# Patient Record
Sex: Male | Born: 1935 | Race: White | Hispanic: No | Smoking: Former smoker
Health system: Southern US, Community
[De-identification: ages and names within clinical notes are randomized; demographics above are authoritative.]

## PROBLEM LIST (undated history)

## (undated) DIAGNOSIS — I2510 Atherosclerotic heart disease of native coronary artery without angina pectoris: Secondary | ICD-10-CM

## (undated) DIAGNOSIS — N289 Disorder of kidney and ureter, unspecified: Secondary | ICD-10-CM

## (undated) DIAGNOSIS — I509 Heart failure, unspecified: Secondary | ICD-10-CM

## (undated) DIAGNOSIS — L723 Sebaceous cyst: Secondary | ICD-10-CM

## (undated) HISTORY — PX: CORONARY ANGIOPLASTY WITH STENT PLACEMENT: SHX49

## (undated) HISTORY — PX: KNEE SURGERY: SHX244

## (undated) HISTORY — PX: IRRIGATION AND DEBRIDEMENT SEBACEOUS CYST: SHX5255

## (undated) HISTORY — PX: HEMORROIDECTOMY: SUR656

## (undated) HISTORY — DX: Sebaceous cyst: L72.3

---

## 2000-02-13 ENCOUNTER — Ambulatory Visit (HOSPITAL_COMMUNITY): Admission: RE | Admit: 2000-02-13 | Discharge: 2000-02-14 | Payer: Self-pay | Admitting: Cardiovascular Disease

## 2007-03-10 ENCOUNTER — Emergency Department (HOSPITAL_COMMUNITY): Admission: EM | Admit: 2007-03-10 | Discharge: 2007-03-10 | Payer: Self-pay | Admitting: Emergency Medicine

## 2007-07-03 ENCOUNTER — Encounter (INDEPENDENT_AMBULATORY_CARE_PROVIDER_SITE_OTHER): Payer: Self-pay | Admitting: General Surgery

## 2007-07-03 ENCOUNTER — Ambulatory Visit (HOSPITAL_COMMUNITY): Admission: RE | Admit: 2007-07-03 | Discharge: 2007-07-03 | Payer: Self-pay | Admitting: General Surgery

## 2010-07-03 NOTE — Op Note (Signed)
NAME:  Larry Morrow, Larry Morrow               ACCOUNT NO.:  [id number]   MEDICAL RECORD NO.:  [id number]          PATIENT TYPE:  AMB   LOCATION:  SDS                          FACILITY:  [facility]   PHYSICIAN:  Gabrielle Dare. Janee Morn, M.D.DATE OF BIRTH:  03/22/1923   DATE OF PROCEDURE:  DATE OF DISCHARGE:  07/03/2007                               OPERATIVE REPORT   PREOPERATIVE DIAGNOSES:  Cyst, left hip and cyst, back.   POSTOPERATIVE DIAGNOSES:  Cyst in left hip and cyst in back.   PROCEDURES:  Excision of cyst, left hip, 4 cm with layered closure and  excision of cyst, back, 3 cm with layered closure.   SURGEON:  Gabrielle Dare. Janee Morn, M.D.   ANESTHESIA:  MAC.   HISTORY OF PRESENT ILLNESS:  [name] is an 77 year old gentleman  whom I evaluated in the office for symptomatic likely sebaceous cyst on  his left hip and back.  He presents today for elective excision.   PROCEDURE IN DETAIL:  Informed consent was obtained.  The patient was  identified in the preop holding area.  His sites were marked.  He was  brought to the operating room and MAC anesthesia was administered by the  anesthesia staff.  He was placed in prone position.  Left hip and back  were prepped and draped in the sterile fashion.  A 0.25% Marcaine with  epinephrine mixed with 1% lidocaine plain was injected for local  anesthetic around the cyst on his left hip first.  An elliptical  incision was made to encompass the 2 skin defects that were present with  exposed cyst tissue.  Subcutaneous tissues were dissected down.  The  cyst sac was quite superficial, but large and extended deep.  It was  circumferentially dissected to excise the entire cyst using sharp  dissection.  Bovie cautery was then used to obtain hemostasis.  This was  sent to pathology.  The wound was copiously irrigated.  Some additional  local anesthetic was injected.  Hemostasis was ensured. The wound was  then closed in layers with deep tissue was approximated  with interrupted  3-0 Vicryl suture and skin was closed with running 4-0 Monocryl  subcuticular stitch.  Dermabond was then placed as the final closure.  Attention was redirected to the cyst on the back.  The area was  infiltrated with local as previously.  An elliptical incision was made  to include some of the overlying skin as well as the sinus.  There were  some breakdown of the skin, so a slight ellipse was created.  Subcutaneous tissues were dissected down to the cyst sac.  The sac was  circumferentially dissected completely excising it.  The wound was  copiously irrigated.  Meticulous hemostasis was obtained using Bovie  cautery.  The wound was then closed in layers with subcutaneous tissue  approximated with interrupted 3-0 Vicryl suture and the skin was closed  with running 4-0  Monocryl subcuticular stitch.  Dermabond was then placed as the final  closure.  Sponge, needle, and instrument counts were all correct.  The  patient tolerated the procedure well  without apparent complications.  He  was taken to the recovery room in stable condition.      Gabrielle Dare Janee Morn, M.D.  Electronically Signed     BET/MEDQ  D:  07/03/2007  T:  07/03/2007  Job:  [id number]   cc:   Barry Dienes. Eloise Harman, M.D.

## 2010-07-05 ENCOUNTER — Encounter (HOSPITAL_COMMUNITY)
Admission: RE | Admit: 2010-07-05 | Discharge: 2010-07-05 | Disposition: A | Discharge status: Home / Self Care | Payer: Medicare Other | Source: Ambulatory Visit | Attending: General Surgery | Admitting: General Surgery

## 2010-07-05 ENCOUNTER — Ambulatory Visit (HOSPITAL_COMMUNITY)
Admission: RE | Admit: 2010-07-05 | Discharge: 2010-07-05 | Disposition: A | Discharge status: Home / Self Care | Payer: Medicare Other | Source: Ambulatory Visit | Attending: General Surgery | Admitting: General Surgery

## 2010-07-05 ENCOUNTER — Other Ambulatory Visit (HOSPITAL_COMMUNITY): Payer: Self-pay | Admitting: General Surgery

## 2010-07-05 DIAGNOSIS — Z9889 Other specified postprocedural states: Secondary | ICD-10-CM

## 2010-07-05 DIAGNOSIS — Z01810 Encounter for preprocedural cardiovascular examination: Secondary | ICD-10-CM | POA: Insufficient documentation

## 2010-07-05 DIAGNOSIS — Z01812 Encounter for preprocedural laboratory examination: Secondary | ICD-10-CM | POA: Insufficient documentation

## 2010-07-05 DIAGNOSIS — Z01818 Encounter for other preprocedural examination: Secondary | ICD-10-CM | POA: Insufficient documentation

## 2010-07-05 LAB — BASIC METABOLIC PANEL
BUN: 15 mg/dL (ref 6–23)
CO2: 31 mEq/L (ref 19–32)
Chloride: 103 mEq/L (ref 96–112)
Creatinine, Ser: 0.79 mg/dL (ref 0.4–1.5)
Potassium: 3.9 mEq/L (ref 3.5–5.1)

## 2010-07-05 LAB — CBC
HCT: 38.9 % — ABNORMAL LOW (ref 39.0–52.0)
Hemoglobin: 13.3 g/dL (ref 13.0–17.0)
MCV: 96 fL (ref 78.0–100.0)
RBC: 4.05 MIL/uL — ABNORMAL LOW (ref 4.22–5.81)
RDW: 12.1 % (ref 11.5–15.5)
WBC: 6 10*3/uL (ref 4.0–10.5)

## 2010-07-10 ENCOUNTER — Ambulatory Visit (HOSPITAL_COMMUNITY)
Admission: RE | Admit: 2010-07-10 | Discharge: 2010-07-10 | Disposition: A | Discharge status: Home / Self Care | Payer: Medicare Other | Source: Ambulatory Visit | Attending: General Surgery | Admitting: General Surgery

## 2010-07-10 ENCOUNTER — Other Ambulatory Visit: Payer: Self-pay | Admitting: General Surgery

## 2010-07-10 DIAGNOSIS — E119 Type 2 diabetes mellitus without complications: Secondary | ICD-10-CM | POA: Insufficient documentation

## 2010-07-10 DIAGNOSIS — Z01818 Encounter for other preprocedural examination: Secondary | ICD-10-CM | POA: Insufficient documentation

## 2010-07-10 DIAGNOSIS — Z01810 Encounter for preprocedural cardiovascular examination: Secondary | ICD-10-CM | POA: Insufficient documentation

## 2010-07-10 DIAGNOSIS — Z01812 Encounter for preprocedural laboratory examination: Secondary | ICD-10-CM | POA: Insufficient documentation

## 2010-07-10 DIAGNOSIS — D235 Other benign neoplasm of skin of trunk: Secondary | ICD-10-CM | POA: Insufficient documentation

## 2010-07-10 LAB — GLUCOSE, CAPILLARY
Glucose-Capillary: 119 mg/dL — ABNORMAL HIGH (ref 70–99)
Glucose-Capillary: 121 mg/dL — ABNORMAL HIGH (ref 70–99)

## 2010-07-11 ENCOUNTER — Emergency Department (HOSPITAL_COMMUNITY)
Admission: EM | Admit: 2010-07-11 | Discharge: 2010-07-11 | Disposition: A | Discharge status: Home / Self Care | Payer: Medicare Other | Attending: Emergency Medicine | Admitting: Emergency Medicine

## 2010-07-11 DIAGNOSIS — N400 Enlarged prostate without lower urinary tract symptoms: Secondary | ICD-10-CM | POA: Insufficient documentation

## 2010-07-11 DIAGNOSIS — Z79899 Other long term (current) drug therapy: Secondary | ICD-10-CM | POA: Insufficient documentation

## 2010-07-11 DIAGNOSIS — R109 Unspecified abdominal pain: Secondary | ICD-10-CM | POA: Insufficient documentation

## 2010-07-11 DIAGNOSIS — N3289 Other specified disorders of bladder: Secondary | ICD-10-CM | POA: Insufficient documentation

## 2010-07-11 DIAGNOSIS — R339 Retention of urine, unspecified: Secondary | ICD-10-CM | POA: Insufficient documentation

## 2010-07-11 DIAGNOSIS — I2510 Atherosclerotic heart disease of native coronary artery without angina pectoris: Secondary | ICD-10-CM | POA: Insufficient documentation

## 2010-07-11 LAB — URINALYSIS, ROUTINE W REFLEX MICROSCOPIC
Nitrite: NEGATIVE
Specific Gravity, Urine: 1.017 (ref 1.005–1.030)
Urobilinogen, UA: 0.2 mg/dL (ref 0.0–1.0)
pH: 7 (ref 5.0–8.0)

## 2010-07-11 LAB — CBC
HCT: 36.9 % — ABNORMAL LOW (ref 39.0–52.0)
Platelets: 138 10*3/uL — ABNORMAL LOW (ref 150–400)
RBC: 3.87 MIL/uL — ABNORMAL LOW (ref 4.22–5.81)
RDW: 12.3 % (ref 11.5–15.5)
WBC: 7.3 10*3/uL (ref 4.0–10.5)

## 2010-07-11 LAB — DIFFERENTIAL
Basophils Absolute: 0 10*3/uL (ref 0.0–0.1)
Eosinophils Absolute: 0.1 10*3/uL (ref 0.0–0.7)
Eosinophils Relative: 1 % (ref 0–5)
Lymphocytes Relative: 9 % — ABNORMAL LOW (ref 12–46)
Lymphs Abs: 0.6 10*3/uL — ABNORMAL LOW (ref 0.7–4.0)
Neutrophils Relative %: 84 % — ABNORMAL HIGH (ref 43–77)

## 2010-07-11 LAB — BASIC METABOLIC PANEL
Chloride: 104 mEq/L (ref 96–112)
GFR calc non Af Amer: >60 mL/min (ref >60)
Potassium: 4 mEq/L (ref 3.5–5.1)
Sodium: 138 mEq/L (ref 135–145)

## 2010-07-11 NOTE — Op Note (Signed)
  NAME:  GAD, AYMOND NO.:  [id number]  MEDICAL RECORD NO.:  [id number]           PATIENT TYPE:  O  LOCATION:  SDSC                         FACILITY:  [facility]  PHYSICIAN:  Gabrielle Dare. Janee Morn, M.D.DATE OF BIRTH:  02/18/1924  DATE OF PROCEDURE:  07/10/2010 DATE OF DISCHARGE:  07/10/2010                              OPERATIVE REPORT   PREOPERATIVE DIAGNOSIS:  Cyst, left back.  POSTOPERATIVE DIAGNOSIS:  Cyst, left back.  PROCEDURE:  Excision of cyst on left back, 4 cm with layered closure.  SURGEON:  Gabrielle Dare. Janee Morn, MD  ANESTHESIA:  General endotracheal.  HISTORY OF PRESENT ILLNESS:  Mr. Rubin is an 77 year old gentleman who has a sebaceous cyst on his left back.  It became infected requiring incision and drainage that infection has improved and he presents today for elective excision of this cyst to prevent ongoing problems.  PROCEDURE IN DETAIL:  The patient was identified in the preop holding area.  Informed consent was obtained.  He received intravenous antibiotics.  His site was marked.  He was brought out to the operating room.  General endotracheal anesthesia was administered by the Anesthesia staff.  He was placed in the prone position.  The left back was prepped and draped in a sterile fashion.  We did a time-out procedure.  Next, 0.25% Marcaine with epinephrine was injected.  An elliptical area measured out around the cyst to facilitate complete excision and closure.  Elliptical incision was made.  Subcutaneous tissues were dissected down completely surrounding the cyst without rupturing it and it was excised in one piece.  The area was copiously irrigated.  Meticulous hemostasis was obtained.  Some additional local anesthetic was injected.  We raised flaps above the fascia medially and laterally to facilitate closure.  Subcutaneous tissues were approximated with interrupted 2-0 Vicryl sutures and skin was closed with interrupted 2-0 nylon  sutures in a simple fashion.  They came together nicely without significant tension.  Sponge, needle, and instrument counts were all correct.  Sterile dressing was applied.  The patient tolerated the procedure well without apparent complication and was taken to the recovery room in stable condition.     Gabrielle Dare Janee Morn, M.D.    BET/MEDQ  D:  07/10/2010  T:  07/11/2010  Job:  [id number]  cc:   Barry Dienes. Eloise Harman, M.D.  Electronically Signed by Violeta Gelinas M.D. on 07/11/2010 05:18:17 PM

## 2010-07-18 ENCOUNTER — Emergency Department (HOSPITAL_COMMUNITY)
Admission: EM | Admit: 2010-07-18 | Discharge: 2010-07-18 | Disposition: A | Discharge status: Home / Self Care | Payer: Medicare Other | Attending: Emergency Medicine | Admitting: Emergency Medicine

## 2010-07-18 DIAGNOSIS — N400 Enlarged prostate without lower urinary tract symptoms: Secondary | ICD-10-CM | POA: Insufficient documentation

## 2010-07-18 DIAGNOSIS — R339 Retention of urine, unspecified: Secondary | ICD-10-CM | POA: Insufficient documentation

## 2010-07-18 DIAGNOSIS — I2510 Atherosclerotic heart disease of native coronary artery without angina pectoris: Secondary | ICD-10-CM | POA: Insufficient documentation

## 2010-07-18 LAB — URINALYSIS, ROUTINE W REFLEX MICROSCOPIC
Nitrite: NEGATIVE
Specific Gravity, Urine: 1.018 (ref 1.005–1.030)
Urobilinogen, UA: 0.2 mg/dL (ref 0.0–1.0)

## 2010-07-18 LAB — URINE MICROSCOPIC-ADD ON

## 2010-07-19 LAB — URINE CULTURE: Culture  Setup Time: 201205310227

## 2010-08-17 ENCOUNTER — Emergency Department (HOSPITAL_COMMUNITY)
Admission: EM | Admit: 2010-08-17 | Discharge: 2010-08-17 | Disposition: A | Discharge status: Home / Self Care | Payer: Medicare Other | Attending: Emergency Medicine | Admitting: Emergency Medicine

## 2010-08-17 DIAGNOSIS — R339 Retention of urine, unspecified: Secondary | ICD-10-CM | POA: Insufficient documentation

## 2010-08-17 DIAGNOSIS — K5900 Constipation, unspecified: Secondary | ICD-10-CM | POA: Insufficient documentation

## 2010-08-17 DIAGNOSIS — N400 Enlarged prostate without lower urinary tract symptoms: Secondary | ICD-10-CM | POA: Insufficient documentation

## 2010-08-17 DIAGNOSIS — I2510 Atherosclerotic heart disease of native coronary artery without angina pectoris: Secondary | ICD-10-CM | POA: Insufficient documentation

## 2010-08-17 DIAGNOSIS — R109 Unspecified abdominal pain: Secondary | ICD-10-CM | POA: Insufficient documentation

## 2010-08-17 DIAGNOSIS — E7800 Pure hypercholesterolemia, unspecified: Secondary | ICD-10-CM | POA: Insufficient documentation

## 2010-08-17 LAB — BASIC METABOLIC PANEL
CO2: 23 mEq/L (ref 19–32)
Chloride: 102 mEq/L (ref 96–112)
Creatinine, Ser: 0.79 mg/dL (ref 0.50–1.35)
Sodium: 137 mEq/L (ref 135–145)

## 2010-08-17 LAB — CBC
MCV: 95.1 fL (ref 78.0–100.0)
Platelets: 155 10*3/uL (ref 150–400)
RBC: 4.25 MIL/uL (ref 4.22–5.81)
WBC: 5.8 10*3/uL (ref 4.0–10.5)

## 2010-08-17 LAB — DIFFERENTIAL
Eosinophils Absolute: 0.3 10*3/uL (ref 0.0–0.7)
Lymphs Abs: 1.1 10*3/uL (ref 0.7–4.0)
Neutro Abs: 3.8 10*3/uL (ref 1.7–7.7)
Neutrophils Relative %: 66 % (ref 43–77)

## 2010-08-17 LAB — URINALYSIS, ROUTINE W REFLEX MICROSCOPIC
Glucose, UA: NEGATIVE mg/dL
Leukocytes, UA: NEGATIVE
pH: 6 (ref 5.0–8.0)

## 2010-08-18 LAB — URINE CULTURE
Colony Count: NO GROWTH
Culture: NO GROWTH

## 2010-11-09 ENCOUNTER — Emergency Department (HOSPITAL_COMMUNITY)
Admission: EM | Admit: 2010-11-09 | Discharge: 2010-11-10 | Disposition: A | Discharge status: Home / Self Care | Payer: Medicare Other | Attending: Emergency Medicine | Admitting: Emergency Medicine

## 2010-11-09 DIAGNOSIS — I2510 Atherosclerotic heart disease of native coronary artery without angina pectoris: Secondary | ICD-10-CM | POA: Insufficient documentation

## 2010-11-09 DIAGNOSIS — E7800 Pure hypercholesterolemia, unspecified: Secondary | ICD-10-CM | POA: Insufficient documentation

## 2010-11-09 DIAGNOSIS — R300 Dysuria: Secondary | ICD-10-CM | POA: Insufficient documentation

## 2010-11-09 DIAGNOSIS — T8389XA Other specified complication of genitourinary prosthetic devices, implants and grafts, initial encounter: Secondary | ICD-10-CM | POA: Insufficient documentation

## 2010-11-09 DIAGNOSIS — N400 Enlarged prostate without lower urinary tract symptoms: Secondary | ICD-10-CM | POA: Insufficient documentation

## 2010-11-09 DIAGNOSIS — Y846 Urinary catheterization as the cause of abnormal reaction of the patient, or of later complication, without mention of misadventure at the time of the procedure: Secondary | ICD-10-CM | POA: Insufficient documentation

## 2010-11-09 LAB — COMPREHENSIVE METABOLIC PANEL
ALT: 31
AST: 38 — ABNORMAL HIGH
Albumin: 3.9
Alkaline Phosphatase: 49
BUN: 21
CO2: 21
Calcium: 8.5
Chloride: 105
Creatinine, Ser: 0.96
GFR calc Af Amer: >60
GFR calc non Af Amer: >60
Glucose, Bld: 141 — ABNORMAL HIGH
Potassium: 3.8
Sodium: 137
Total Bilirubin: 1.2
Total Protein: 6.9

## 2010-11-09 LAB — URINALYSIS, ROUTINE W REFLEX MICROSCOPIC
Bilirubin Urine: NEGATIVE
Glucose, UA: NEGATIVE
Ketones, ur: 15 — AB
Leukocytes, UA: NEGATIVE
Nitrite: NEGATIVE
Protein, ur: NEGATIVE
Specific Gravity, Urine: 1.013
Urobilinogen, UA: 0.2
pH: 5.5

## 2010-11-09 LAB — LIPASE, BLOOD: Lipase: 25

## 2010-11-09 LAB — DIFFERENTIAL
Basophils Absolute: 0
Basophils Relative: 0
Eosinophils Absolute: 0.1
Eosinophils Relative: 1
Lymphocytes Relative: 12
Lymphs Abs: 0.9
Monocytes Absolute: 0.7
Monocytes Relative: 9
Neutro Abs: 6.1
Neutrophils Relative %: 78 — ABNORMAL HIGH

## 2010-11-09 LAB — LACTIC ACID, PLASMA: Lactic Acid, Venous: 1.6

## 2010-11-09 LAB — URINE MICROSCOPIC-ADD ON

## 2010-11-09 LAB — CBC
HCT: 41.7
Hemoglobin: 14.5
MCHC: 34.9
MCV: 94.8
Platelets: 164
RBC: 4.4
RDW: 12.6
WBC: 7.9

## 2011-01-10 ENCOUNTER — Emergency Department (HOSPITAL_COMMUNITY)
Admission: EM | Admit: 2011-01-10 | Discharge: 2011-01-11 | Disposition: A | Discharge status: Home / Self Care | Payer: Medicare Other | Attending: Emergency Medicine | Admitting: Emergency Medicine

## 2011-01-10 ENCOUNTER — Encounter (HOSPITAL_COMMUNITY): Payer: Self-pay | Admitting: Emergency Medicine

## 2011-01-10 DIAGNOSIS — J45909 Unspecified asthma, uncomplicated: Secondary | ICD-10-CM | POA: Insufficient documentation

## 2011-01-10 DIAGNOSIS — I2510 Atherosclerotic heart disease of native coronary artery without angina pectoris: Secondary | ICD-10-CM | POA: Insufficient documentation

## 2011-01-10 DIAGNOSIS — I509 Heart failure, unspecified: Secondary | ICD-10-CM | POA: Insufficient documentation

## 2011-01-10 DIAGNOSIS — R339 Retention of urine, unspecified: Secondary | ICD-10-CM

## 2011-01-10 DIAGNOSIS — N289 Disorder of kidney and ureter, unspecified: Secondary | ICD-10-CM | POA: Insufficient documentation

## 2011-01-10 DIAGNOSIS — N390 Urinary tract infection, site not specified: Secondary | ICD-10-CM | POA: Insufficient documentation

## 2011-01-10 DIAGNOSIS — E119 Type 2 diabetes mellitus without complications: Secondary | ICD-10-CM | POA: Insufficient documentation

## 2011-01-10 HISTORY — DX: Disorder of kidney and ureter, unspecified: N28.9

## 2011-01-10 HISTORY — DX: Atherosclerotic heart disease of native coronary artery without angina pectoris: I25.10

## 2011-01-10 HISTORY — DX: Heart failure, unspecified: I50.9

## 2011-01-10 LAB — URINALYSIS, ROUTINE W REFLEX MICROSCOPIC
Nitrite: NEGATIVE
Specific Gravity, Urine: 1.019 (ref 1.005–1.030)
Urobilinogen, UA: 0.2 mg/dL (ref 0.0–1.0)

## 2011-01-10 LAB — URINE MICROSCOPIC-ADD ON

## 2011-01-10 MED ORDER — AMOXICILLIN 500 MG PO CAPS: 500.0000 mg | ORAL_CAPSULE | Freq: Three times a day (TID) | ORAL | Status: DC

## 2011-01-10 MED ORDER — CEPHALEXIN 500 MG PO CAPS: 500.0000 mg | ORAL_CAPSULE | Freq: Four times a day (QID) | ORAL | Status: AC

## 2011-01-10 NOTE — ED Provider Notes (Signed)
History     CSN: [id number] Arrival date & time: 01/10/2011  9:46 PM   First MD Initiated Contact with Patient 01/10/11 2156      No chief complaint on file.   HPI  History provided by the patient and family. Patient presents with complaints of urinary retention and began early this morning. Patient states his last episode of voiding was around 3 AM. He has not been able to void a significant amount since that time. Patient has history of similar symptoms the past and reports having a Foley catheter removed last Monday. Patient currently follows with Dr. Earlene Plater with urology. Symptoms progressed gradually throughout the day with fullness in lower pelvic area and some pain. Patient denies symptoms of fever, chills, sweats, nausea and vomiting. Patient denies flank pain.   Past Medical History  Diagnosis Date  . Asthma   . CHF (congestive heart failure)   . Coronary artery disease   . Diabetes mellitus   . Renal disorder     No past surgical history on file.  No family history on file.  History  Substance Use Topics  . Smoking status: Former Games developer  . Smokeless tobacco: Not on file  . Alcohol Use: No      Review of Systems  Constitutional: Negative for fever and chills.  Respiratory: Negative for cough and shortness of breath.   Gastrointestinal: Negative for nausea, vomiting, diarrhea and constipation.  Genitourinary: Positive for difficulty urinating. Negative for dysuria, frequency, hematuria and flank pain.  Neurological: Negative for light-headedness and headaches.  All other systems reviewed and are negative.    Allergies  Review of patient's allergies indicates no known allergies.  Home Medications  No current outpatient prescriptions on file.  BP 154/67  Pulse 92  Temp(Src) 97.5 F (36.4 C) (Oral)  Resp 18  Ht 5\' 9"  (1.753 m)  Wt 164 lb (74.39 kg)  BMI 24.22 kg/m2  SpO2 97%  Physical Exam  Nursing note and vitals reviewed. Constitutional: He is  oriented to person, place, and time. He appears well-developed and well-nourished. No distress.  HENT:  Head: Normocephalic.  Cardiovascular: Normal rate and regular rhythm.   No murmur heard. Pulmonary/Chest: Effort normal.  Abdominal: Soft. He exhibits no distension. There is tenderness in the suprapubic area. There is no rebound and no guarding.  Neurological: He is alert and oriented to person, place, and time. No cranial nerve deficit.  Skin: Skin is warm.  Psychiatric: He has a normal mood and affect.    ED Course  Procedures (including critical care time)  Labs Reviewed  URINALYSIS, ROUTINE W REFLEX MICROSCOPIC - Abnormal; Notable for the following:    Appearance CLOUDY (*)    Glucose, UA 250 (*)    Hgb urine dipstick MODERATE (*)    Ketones, ur TRACE (*)    Protein, ur 30 (*)    Leukocytes, UA LARGE (*)    All other components within normal limits  URINE MICROSCOPIC-ADD ON - Abnormal; Notable for the following:    Bacteria, UA MANY (*)    All other components within normal limits  URINE CULTURE   Results for orders placed during the hospital encounter of 01/10/11  URINALYSIS, ROUTINE W REFLEX MICROSCOPIC      Component Value Range   Color, Urine YELLOW  YELLOW    Appearance CLOUDY (*) CLEAR    Specific Gravity, Urine 1.019  1.005 - 1.030    pH 6.0  5.0 - 8.0    Glucose, UA 250 (*)  NEGATIVE (mg/dL)   Hgb urine dipstick MODERATE (*) NEGATIVE    Bilirubin Urine NEGATIVE  NEGATIVE    Ketones, ur TRACE (*) NEGATIVE (mg/dL)   Protein, ur 30 (*) NEGATIVE (mg/dL)   Urobilinogen, UA 0.2  0.0 - 1.0 (mg/dL)   Nitrite NEGATIVE  NEGATIVE    Leukocytes, UA LARGE (*) NEGATIVE   URINE MICROSCOPIC-ADD ON      Component Value Range   WBC, UA TOO NUMEROUS TO COUNT  <3 (WBC/hpf)   Bacteria, UA MANY (*) RARE      1. Urinary retention   2. UTI (urinary tract infection)     MDM  10 PM patient seen and evaluated patient in no acute distress. Patient feels relief of symptoms  after Foley catheter placed. Foley catheter with large amounts of drainage at this time. Will send urine for evaluation and culture.        Angus Seller, PA 01/11/11 [phone]

## 2011-01-10 NOTE — ED Notes (Signed)
pATIENT PRESENTED TO THE ER with c/o of urinary retention since 0300 today.  Daughter sts that the patient had a foley cat for the past month and had it removed on Monday.  Patient has been voiding fine until this am

## 2011-01-10 NOTE — ED Notes (Signed)
Patient extremely uncomfortable.  Min. Bladder distension noted on arrival.   Foley 14 Fr inserted  W/o difficulty with return of yellow cloudy urine .  Peter PA in to see and evaluiate

## 2011-01-11 NOTE — ED Notes (Signed)
Computer maint occuring patient unable to give 3 signature.  Prior to d/c patient wants folet bsd changed to leg bag as requested

## 2011-01-11 NOTE — ED Notes (Signed)
Will d/c to home with foley in place per PA's instruction. No further pain once bladder was emptied

## 2011-01-12 NOTE — ED Provider Notes (Signed)
Medical screening examination/treatment/procedure(s) were performed by non-physician practitioner and as supervising physician I was immediately available for consultation/collaboration.   Jackqueline Aquilar A. Patrica Duel, MD 01/12/11 1610

## 2011-01-13 LAB — URINE CULTURE

## 2011-01-14 NOTE — ED Notes (Signed)
+   urine Patient treated with Keflex-sensitive to same-chart appended per protocol MD. 

## 2011-01-22 ENCOUNTER — Encounter (HOSPITAL_COMMUNITY): Payer: Self-pay | Admitting: *Deleted

## 2011-01-22 ENCOUNTER — Emergency Department (HOSPITAL_COMMUNITY)
Admission: EM | Admit: 2011-01-22 | Discharge: 2011-01-22 | Disposition: A | Discharge status: Home / Self Care | Payer: Medicare Other | Attending: Emergency Medicine | Admitting: Emergency Medicine

## 2011-01-22 DIAGNOSIS — R339 Retention of urine, unspecified: Secondary | ICD-10-CM

## 2011-01-22 DIAGNOSIS — I2510 Atherosclerotic heart disease of native coronary artery without angina pectoris: Secondary | ICD-10-CM | POA: Insufficient documentation

## 2011-01-22 DIAGNOSIS — R10819 Abdominal tenderness, unspecified site: Secondary | ICD-10-CM | POA: Insufficient documentation

## 2011-01-22 DIAGNOSIS — E119 Type 2 diabetes mellitus without complications: Secondary | ICD-10-CM | POA: Insufficient documentation

## 2011-01-22 DIAGNOSIS — N390 Urinary tract infection, site not specified: Secondary | ICD-10-CM

## 2011-01-22 LAB — URINE MICROSCOPIC-ADD ON

## 2011-01-22 LAB — URINALYSIS, ROUTINE W REFLEX MICROSCOPIC
Bilirubin Urine: NEGATIVE
Nitrite: POSITIVE — AB
Protein, ur: NEGATIVE mg/dL
Specific Gravity, Urine: 1.01 (ref 1.005–1.030)
Urobilinogen, UA: 0.2 mg/dL (ref 0.0–1.0)

## 2011-01-22 LAB — POCT I-STAT, CHEM 8
Chloride: 106 mEq/L (ref 96–112)
Glucose, Bld: 150 mg/dL — ABNORMAL HIGH (ref 70–99)
HCT: 40 % (ref 39.0–52.0)
Potassium: 3.4 mEq/L — ABNORMAL LOW (ref 3.5–5.1)
Sodium: 139 mEq/L (ref 135–145)

## 2011-01-22 MED ORDER — SULFAMETHOXAZOLE-TRIMETHOPRIM 800-160 MG PO TABS: 1.0000 | ORAL_TABLET | Freq: Two times a day (BID) | ORAL | Status: AC

## 2011-01-22 NOTE — Discharge Planning (Signed)
Spoke with Larry Morrow Dtr who confirms she is spoke with staff at Larry Morrow and Larry Morrow office.  Appt made with Larry Morrow for next Monday, December, 9, 2012 and scheduled to call back to speak again with Larry Larry Morrow on 01/23/11 since the office is closed for today.  Larry states she will contact Larry Morrow if needed Larry Morrow Explained to Larry Larry Morrow order is needed prior to Larry Morrow being able to assist.  Larry Morrow states it may be 1 week before Larry Morrow may need Morrow Informed Larry Morrow and Larry Morrow forms sent in mail to her and Larry Morrow

## 2011-01-22 NOTE — Discharge Planning (Signed)
Unable to reach staff at Dr Jarome Matin office [phone]) Office closed from 12-2 pm per answering service

## 2011-01-22 NOTE — Discharge Planning (Signed)
CM left a message with answering service for Dr Jinny Sanders RN at [phone] Requested a return call

## 2011-01-22 NOTE — Discharge Planning (Signed)
ED CM went to see pt in Rm #10 at 1110. Velna Hatchet, RN states pt "just left with his daughter"  Called pt and daughter, Gigi Gin at pt home [phone] at 11:57 am. CM discussed differences between home health and private duty services (LOS, duties, insurance coverage) .  Pt states presently he has an urinary infection per ED lab results obtained 01/22/11.  Pt stating he does not need staff to stay "overnight". Gigi Gin, who lives at address [address] [place], [place] [address], states pt does need someone to stay overnight when pt has to return to in/out catheterization. Daughter generally comes to stay with pt  Monday- Tuesday every week.  Pt now presently has a foley & antibiotics for 7 days ordered during [facility] ED visit 01/22/11.  Has previously used a foley for 6 months.   Urologist Darvin Neighbours) office staff , has ordered caude and/or straight catheters. Permission given to Cm by pt to contact Dr Earlene Plater office.  Pt states Dr Ivery Quale will not be familiar with this issue.  Pt chose and agreed [facility] use [facility] RN + other services.  Pt emphasized he would like also to be instructed on "breathing", & Mobility Pt has noted decrease in success with in/out cath due to recurrent infection.   Agreed [facility] may note have a skill to instruct until the foley is removed Pt has no follow up appointments with pcp nor urologist at this time

## 2011-01-22 NOTE — ED Provider Notes (Signed)
Medical screening examination/treatment/procedure(s) were performed by non-physician practitioner and as supervising physician I was immediately available for consultation/collaboration.  Flint Melter, MD 01/22/11 2105

## 2011-01-22 NOTE — ED Notes (Signed)
Pt states "had foley removed from Dr. Earlene Plater office yesterday, was provided with self caths"; pt has only been successful x 1

## 2011-01-22 NOTE — Discharge Planning (Signed)
ED CM spoke to [facility] at Dr Eloise Harman office.  Liborio Nixon states pt's daughter called office earlier today, the office is now closed and the dtr Knows to call back to Dr Eloise Harman office on 01/23/11 to have it setup.

## 2011-01-22 NOTE — ED Provider Notes (Signed)
History     CSN: [id number] Arrival date & time: 01/22/2011  7:07 AM   First MD Initiated Contact with Patient 01/22/11 [phone]      Chief Complaint  Patient presents with  . Urinary Retention   HPI Patient presents to the emergency department with complaint of urinary retention for the past day. Reports that he was seen at Dr. Earlene Plater office yesterday, and advised to self cath. Did this times once, at 1 am. However, is scared to do this on his own. Reports some mild suprapubic pressure. Denies any nausea, vomiting, fevers, chills, back pain or other complaints.   Past Medical History  Diagnosis Date  . Asthma   . CHF (congestive heart failure)   . Coronary artery disease   . Diabetes mellitus   . Renal disorder     Past Surgical History  Procedure Date  . Hemorroidectomy   . Knee surgery     bilat  . Coronary angioplasty with stent placement     No family history on file.  History  Substance Use Topics  . Smoking status: Former Games developer  . Smokeless tobacco: Not on file  . Alcohol Use: No      Review of Systems  Constitutional: Negative for fever, chills, diaphoresis and appetite change.  HENT: Negative for neck pain.   Eyes: Negative for photophobia and visual disturbance.  Respiratory: Negative for cough, chest tightness and shortness of breath.   Cardiovascular: Negative for chest pain.  Gastrointestinal: Negative for nausea and vomiting.  Genitourinary: Positive for difficulty urinating. Negative for dysuria, hematuria, flank pain, discharge, penile swelling, scrotal swelling, genital sores, penile pain and testicular pain.  Musculoskeletal: Negative for back pain.  Skin: Negative for rash.  Neurological: Negative for weakness and numbness.  All other systems reviewed and are negative.    Allergies  Review of patient's allergies indicates no known allergies.  Home Medications   Current Outpatient Rx  Name Route Sig Dispense Refill  . FIBER 5 GM/15ML PO  LIQD Oral Take 5 mLs by mouth daily. constipation     . SILODOSIN 8 MG PO CAPS Oral Take 8 mg by mouth daily with breakfast.      . SIMVASTATIN 40 MG PO TABS Oral Take 40 mg by mouth at bedtime.        BP 123/82  Pulse 77  Temp(Src) 97.2 F (36.2 C) (Oral)  Resp 20  Wt 154 lb (69.854 kg)  SpO2 98%  Physical Exam  Constitutional: He is oriented to person, place, and time. He appears well-developed and well-nourished.  HENT:  Head: Normocephalic and atraumatic.  Neck: Normal range of motion. Neck supple.  Cardiovascular: Normal rate and regular rhythm.   Pulmonary/Chest: Effort normal and breath sounds normal.  Abdominal:       Suprapubic tenderness  Genitourinary: Penis normal. No penile tenderness.       Chaperone present  Neurological: He is alert and oriented to person, place, and time.  Skin: Skin is dry. No rash noted. He is not diaphoretic. No erythema.    ED Course  Procedures (including critical care time)  Patient seen and evaluated.  VSS reviewed. . Nursing notes reviewed. Discussed with attending physician, Dr. Effie Shy. Initial testing ordered. Will monitor the patient closely. They agree with the treatment plan and diagnosis.   Results for orders placed during the hospital encounter of 01/22/11  URINALYSIS, ROUTINE W REFLEX MICROSCOPIC      Component Value Range   Color, Urine YELLOW  YELLOW  APPearance CLOUDY (*) CLEAR    Specific Gravity, Urine 1.010  1.005 - 1.030    pH 6.0  5.0 - 8.0    Glucose, UA NEGATIVE  NEGATIVE (mg/dL)   Hgb urine dipstick LARGE (*) NEGATIVE    Bilirubin Urine NEGATIVE  NEGATIVE    Ketones, ur NEGATIVE  NEGATIVE (mg/dL)   Protein, ur NEGATIVE  NEGATIVE (mg/dL)   Urobilinogen, UA 0.2  0.0 - 1.0 (mg/dL)   Nitrite POSITIVE (*) NEGATIVE    Leukocytes, UA LARGE (*) NEGATIVE   POCT I-STAT, CHEM 8      Component Value Range   Sodium 139  135 - 145 (mEq/L)   Potassium 3.4 (*) 3.5 - 5.1 (mEq/L)   Chloride 106  96 - 112 (mEq/L)   BUN 17   6 - 23 (mg/dL)   Creatinine, Ser 1.61  0.50 - 1.35 (mg/dL)   Glucose, Bld 096 (*) 70 - 99 (mg/dL)   Calcium, Ion 0.45  4.09 - 1.32 (mmol/L)   TCO2 22  0 - 100 (mmol/L)   Hemoglobin 13.6  13.0 - 17.0 (g/dL)   HCT 81.1  91.4 - 78.2 (%)  URINE MICROSCOPIC-ADD ON      Component Value Range   Squamous Epithelial / LPF FEW (*) RARE    WBC, UA 11-20  <3 (WBC/hpf)   RBC / HPF TOO NUMEROUS TO COUNT  <3 (RBC/hpf)   Bacteria, UA MANY (*) RARE    9:33 AM last culture report showed that his urine was sensitive to septra and most medications.   10:23 AM spoke to pharamacist, will treat with septa for 7 days  10:05 AM spoke extensively to dr. Eloise Harman, family, and case manager about care plan. Final plan is to place foley catheter in, until the patient can have home health, which the case manager said would be within the next day. Will start on antibiotics.   MDM  Urinary retention, no renal insufficiency.  Urinary tract infection          Demetrius Charity, PA 01/22/11 1023

## 2011-01-24 LAB — URINE CULTURE

## 2011-01-26 NOTE — ED Notes (Signed)
+   urine culture. Treated with Septra, sensitive to same per protocol MD. 

## 2011-03-18 ENCOUNTER — Ambulatory Visit
Admission: RE | Admit: 2011-03-18 | Discharge: 2011-03-18 | Disposition: A | Discharge status: Home / Self Care | Payer: Medicare Other | Source: Ambulatory Visit | Attending: Urology | Admitting: Urology

## 2011-03-18 ENCOUNTER — Other Ambulatory Visit: Payer: Self-pay | Admitting: Urology

## 2011-03-18 DIAGNOSIS — M549 Dorsalgia, unspecified: Secondary | ICD-10-CM

## 2011-04-22 ENCOUNTER — Encounter: Payer: Self-pay | Admitting: Internal Medicine

## 2011-04-25 ENCOUNTER — Other Ambulatory Visit (INDEPENDENT_AMBULATORY_CARE_PROVIDER_SITE_OTHER): Payer: Medicare Other

## 2011-04-25 ENCOUNTER — Ambulatory Visit (INDEPENDENT_AMBULATORY_CARE_PROVIDER_SITE_OTHER): Payer: Medicare Other | Admitting: Internal Medicine

## 2011-04-25 ENCOUNTER — Encounter: Payer: Self-pay | Admitting: Internal Medicine

## 2011-04-25 VITALS — BP 110/60 | HR 76 | Temp 98.6°F | Ht 69.5 in | Wt 150.1 lb

## 2011-04-25 DIAGNOSIS — K921 Melena: Secondary | ICD-10-CM

## 2011-04-25 DIAGNOSIS — R339 Retention of urine, unspecified: Secondary | ICD-10-CM

## 2011-04-25 DIAGNOSIS — M1990 Unspecified osteoarthritis, unspecified site: Secondary | ICD-10-CM | POA: Insufficient documentation

## 2011-04-25 DIAGNOSIS — E785 Hyperlipidemia, unspecified: Secondary | ICD-10-CM | POA: Insufficient documentation

## 2011-04-25 DIAGNOSIS — I2510 Atherosclerotic heart disease of native coronary artery without angina pectoris: Secondary | ICD-10-CM | POA: Insufficient documentation

## 2011-04-25 DIAGNOSIS — K5900 Constipation, unspecified: Secondary | ICD-10-CM | POA: Insufficient documentation

## 2011-04-25 LAB — CBC WITH DIFFERENTIAL/PLATELET
Basophils Absolute: 0 10*3/uL (ref 0.0–0.1)
Eosinophils Absolute: 0.2 10*3/uL (ref 0.0–0.7)
Hemoglobin: 12.9 g/dL — ABNORMAL LOW (ref 13.0–17.0)
Lymphocytes Relative: 19.1 % (ref 12.0–46.0)
MCHC: 34 g/dL (ref 30.0–36.0)
Monocytes Relative: 11.2 % (ref 3.0–12.0)
Neutro Abs: 4.3 10*3/uL (ref 1.4–7.7)
Neutrophils Relative %: 66.8 % (ref 43.0–77.0)
Platelets: 197 10*3/uL (ref 150.0–400.0)
RDW: 13 % (ref 11.5–14.6)

## 2011-04-25 MED ORDER — SENNA-DOCUSATE SODIUM 8.6-50 MG PO TABS: 1.0000 | ORAL_TABLET | Freq: Every day | ORAL | Status: DC

## 2011-04-25 NOTE — Progress Notes (Signed)
Subjective:    Patient ID: Larry Para., male    DOB: October 31, 1923, 78 y.o.   MRN: [id number]  HPI Larry Morrow is an 78 yo male with past medical history of hyperlipidemia, chronic insomnia, osteoporosis, CAD, ED who is seen in consultation at the request of Dr. Eloise Harman her evaluation of melena. The patient reports today recent issue with black stool. This started several weeks ago, but seemed to clear about 4 days ago. He reports prior to clearing his black stool, he was having on and off over several weeks. He reports it seemed to happen more when he was using aspirin and ibuprofen for joint pain. He denies abdominal pain, nausea or vomiting. His appetite has been good. He reports now his stools are brown, but really hard and difficult to pass. He reports long-standing issues with constipation. He added a fiber supplement which she states worked well at first, but seemed to lose its efficacy. He then tried MiraLAX which also worked well at first but eventually led to too much gas and stools which were too loose. He tried reducing the MiraLAX dose in half but still was not happy with the result. He denies hematemesis. He does report occasional scant bright red blood with wiping which he attributes to hemorrhoids. He recalls hemorrhoidectomy in his early 90s and has had scant rectal bleeding ever since. One of his biggest complaint today, continues to be urinary retention issues with frequent urination at night. He does intermittently self catheterize himself, but reports not having to do this for 4 or 5 days now. He denies dysuria. Denies hematuria. Again, no abdominal or suprapubic pain.  He was seen by his primary care provider in late February and started on Prilosec 20 mg twice a day. He has been taking this as directed, and he is also tried to avoid aspirin and ibuprofen recently  Review of Systems As per history of present illness, otherwise negative  Past Medical History  Diagnosis Date    . Asthma   . CHF (congestive heart failure)     patient denies  . Coronary artery disease   . Diabetes mellitus   . Renal disorder   . Sebaceous cyst     on the back   Past Surgical History  Procedure Date  . Hemorroidectomy   . Knee surgery     left x2  . Coronary angioplasty with stent placement   . Irrigation and debridement sebaceous cyst     removed off back   Current Outpatient Prescriptions  Medication Sig Dispense Refill  . ciprofloxacin (CIPRO) 500 MG tablet Take 500 mg by mouth 2 (two) times daily.      . Fiber 5 GM/15ML LIQD Take 5 mLs by mouth daily. constipation       . finasteride (PROSCAR) 5 MG tablet Take 5 mg by mouth daily.      Marland Kitchen omeprazole (PRILOSEC) 20 MG capsule Take 20 mg by mouth 2 (two) times daily.      . silodosin (RAPAFLO) 8 MG CAPS capsule Take 8 mg by mouth daily with breakfast.        . simvastatin (ZOCOR) 40 MG tablet Take 40 mg by mouth at bedtime.        . sennosides-docusate sodium (SENOKOT-S) 8.6-50 MG tablet Take 1 tablet by mouth daily.  30 tablet  3   No Known Allergies  Family History  Problem Relation Age of Onset  . Heart disease Father     Social History  .  Marital Status: Widowed    Number of Children: 4   Occupational History  . retired    Social History Main Topics  . Smoking status: Former Smoker    Types: Cigarettes  . Smokeless tobacco: Never Used  . Alcohol Use: Yes     2 oz of wine per day  . Drug Use: No      Objective:   Physical Exam BP 110/60  Pulse 76  Temp 98.6 F (37 C)  Ht 5' 9.5" (1.765 m)  Wt 150 lb 2 oz (68.096 kg)  BMI 21.85 kg/m2 Constitutional: Well-developed and well-nourished. No distress. HEENT: Normocephalic and atraumatic. Oropharynx is clear and moist. No oropharyngeal exudate. Conjunctivae are normal. Pupils are equal round and reactive to light. No scleral icterus. Neck: Neck supple. Trachea midline. Cardiovascular: Normal rate, regular rhythm and intact distal pulses. No  M/R/G Pulmonary/chest: Effort normal and breath sounds normal. No wheezing, rales or rhonchi. Abdominal: Soft, nontender, nondistended. Bowel sounds active throughout. There are no masses palpable. No hepatosplenomegaly. Extremities: no clubbing, cyanosis, or edema Rectal: Patient declines Lymphadenopathy: No cervical adenopathy noted. Neurological: Alert and oriented to person place and time. Skin: Skin is warm and dry. No rashes noted. Psychiatric: Normal mood and affect. Behavior is normal.  Labs from primary care dated 04/04/2011 WBC 5.9, hemoglobin 14.4, hematocrit 42.3, platelet 299, MCV 101.3  Sodium 140, potassium 3.8, chloride 106, carbon dioxide 27, BUN 17, creatinine 0.8, glucose 105, Total protein 7.0, albumin 3.3, AST 29 ALT 28 alkaline phosphatase 61 total bili 0.5     Assessment & Plan:  78 yo male with past medical history of hyperlipidemia, chronic insomnia, osteoporosis, CAD, ED who is seen in consultation at the request of Dr. Eloise Harman her evaluation of melena  1. Melena -- the patient reports that his black stools resolved about 4 days ago it has not returned. He has been on twice daily PPI for the last 2 weeks, and it is possible that he had gastritis or possibly even peptic ulceration, which has responded favorably to acid suppression. At this point I have recommended repeat CBC to ensure he is not becoming anemic, and I have recommended upper endoscopy. He wishes to avoid upper endoscopy and declines scheduling this today. He is nervous about sedated procedures at his age. With this in mind, I have recommended he continue the twice-daily PPI for now, as this would treat gastritis and peptic ulceration should this be the etiology. I also have recommended that he continue to avoid NSAIDs whenever possible. If his melena returns, or he develop other alarm symptoms such as epigastric pain, nausea or vomiting, then we will have to revisit possible upper endoscopy.  2.  Constipation -- this is a long-standing issue for the patient and according to his last primary care office visit, he has documented last date of colonoscopy 04/05/2010.  I do not have his colonoscopy report. He did not have a favorable response to MiraLAX, and therefore we will attempt a trial of senna plus docusate one capsule daily. He can continue with his fiber supplementation. I also have encouraged him to drink plenty of fluids throughout the day. We will see him back in 2-4 weeks' time to re\re assess his symptoms. At this polyp we will also insure no further melena or concern of upper GI bleeding.  3. Urinary retention -- this is one of his biggest complaints today, and at present he is reluctant to return to urology. I've advised him that I have no  further recommendations regarding his urinary issues, but referred him back to his primary care and urology for this issue. He is already on silodosin and finasteride.

## 2011-04-25 NOTE — Patient Instructions (Signed)
Your physician has requested that you go to the basement for the following lab work before leaving today: CBC  Continue taking Omeprazole 20mg  daily and your fiber supplement.   Dr. Rhea Belton would like to see you back in the office in 1 month for a follow up appointment.

## 2011-06-10 ENCOUNTER — Encounter: Payer: Self-pay | Admitting: Internal Medicine

## 2011-06-11 ENCOUNTER — Ambulatory Visit (INDEPENDENT_AMBULATORY_CARE_PROVIDER_SITE_OTHER): Payer: Medicare Other | Admitting: Internal Medicine

## 2011-06-11 ENCOUNTER — Encounter: Payer: Self-pay | Admitting: Internal Medicine

## 2011-06-11 DIAGNOSIS — Z79899 Other long term (current) drug therapy: Secondary | ICD-10-CM

## 2011-06-11 DIAGNOSIS — Z5189 Encounter for other specified aftercare: Secondary | ICD-10-CM

## 2011-06-11 DIAGNOSIS — K5900 Constipation, unspecified: Secondary | ICD-10-CM

## 2011-06-11 DIAGNOSIS — M545 Low back pain: Secondary | ICD-10-CM

## 2011-06-11 MED ORDER — SENNA-DOCUSATE SODIUM 8.6-50 MG PO TABS: 1.0000 | ORAL_TABLET | Freq: Every day | ORAL | Status: AC

## 2011-06-11 NOTE — Progress Notes (Signed)
Subjective:    Patient ID: Larry Para., male    DOB: [birth year], 78 y.o.   MRN: [id number]  HPI Larry Fiedler, MD 04/25/2011 1:06 PM Signed  Subjective:   Patient ID: Larry Para., male DOB: 29-Jul-1923, 78 y.o. MRN: [id number]  HPI  Larry Morrow is an 78 yo male with past medical history of hyperlipidemia, chronic insomnia, osteoporosis, CAD, BPH who is seen in followup. He was seen approximately one month ago with concern of melena, which had resolved at the time of his appointment. At that time, he was complaining of significant constipation and had an incomplete response to MiraLAX therapy. He returns today, stating that his constipation is better with treatment. He is currently using senna S2 tablets as needed. He reports that he is using these when he develops constipation. As things are now, this is about every 2 weeks. He is happy with his current bowel movements, and he denies any further melena or bright red rectal bleeding. He has discontinued the use of his omeprazole at this point. He denies abdominal pain. No nausea or vomiting. No heartburn, dysphagia or odynophagia. He does continue to report lower back pain and spasms which is worse when he is attempting to urinate, and when he has to strain at stool. He also brings in his medications, and wishes to discuss his simvastatin as well as his BPH medications.  Review of Systems As per history of present illness, otherwise negative  Current Medications, Allergies, Past Medical History, Past Surgical History, Family History and Social History were reviewed in [employer] record.     Objective:   Physical Exam BP 90/50  Pulse 76  Ht 5' 9.5" (1.765 m)  Wt 148 lb 6 oz (67.302 kg)  BMI 21.60 kg/m2 Constitutional: Well-developed and well-nourished. No distress.  HEENT: Normocephalic and atraumatic. Oropharynx is clear and moist. No oropharyngeal exudate. Conjunctivae are normal. Pupils are equal round  and reactive to light. No scleral icterus.  Neck: Neck supple. Trachea midline.  Cardiovascular: Normal rate, regular rhythm and intact distal pulses. No M/R/G  Pulmonary/chest: Effort normal and breath sounds normal. No wheezing, rales or rhonchi.  Abdominal: Soft, nontender, nondistended. Bowel sounds active throughout. There are no masses palpable. No hepatosplenomegaly.  Extremities: no clubbing, cyanosis, or edema  Neurological: Alert and oriented to person place and time.  Skin: Skin is warm and dry. No rashes noted.  Psychiatric: Normal mood and affect. Behavior is normal.     Assessment & Plan:  78 yo male with past medical history of hyperlipidemia, chronic insomnia, osteoporosis, CAD, BPH who is seen in followup.  1. Melena -- resolved. He completed approximately one month of PPI therapy. He is asymptomatic, and at this point I think it is okay to discontinue PPI therapy going forward. He is aware of my recommendation to notify my office immediately should melena return.  2. Constipation -- we'll discuss this again today and it seems that he is significantly better with the senna S. therapy. I've advised that he consider taking one tablet every day to every other day on a more regular basis, to avoid having to take it once he is constipated. I rather him avoid constipation than simply treating it once it develops. I think this would help him from a back pain standpoint as he feels his back pain is considerably worse when he is straining at stool or urine. Again, we briefly discussed colonoscopy, and he wishes to avoid this if  possible. I do not think this test is needed at this time. He is agreeable to titrate the doses of senna S. and consider taking him more regular basis to avoid constipation.  3. Questions about simvastatin and BPH therapy -- I have deferred decisions about the need for this therapy to Dr. Eloise Harman. The patient did bring in literature from the pharmacy specifically  concerning myositis and LFT abnormalities which can be associated with statin therapy. He is aware of my opinion and I do not think his statin therapy is currently contributing to his lower back pain, but again I have asked that he discuss this with Dr. Eloise Harman.  He understands.  He asked to return when necessary only

## 2011-06-11 NOTE — Patient Instructions (Addendum)
Follow up with Dr. Rhea Belton as needed  We have sent the following medications to your pharmacy for you to pick up at your convenience: Senna-S;experiement with 1 tablet every other day as needed.

## 2011-06-19 ENCOUNTER — Telehealth: Payer: Self-pay | Admitting: Gastroenterology

## 2011-06-19 NOTE — Telephone Encounter (Signed)
Faxed Dr. Eloise Harman progress notes from visit with Dr. Rhea Belton

## 2012-10-14 IMAGING — CR DG CHEST 2V
2 series · 2 of 2 positions shown · non-contrast
Comparison: 07/01/2007

CLINICAL DATA: History of smoking.  Preoperative cardiopulmonary
evaluation.

CHEST - 2 VIEW

[view not recorded (1 of 2)]
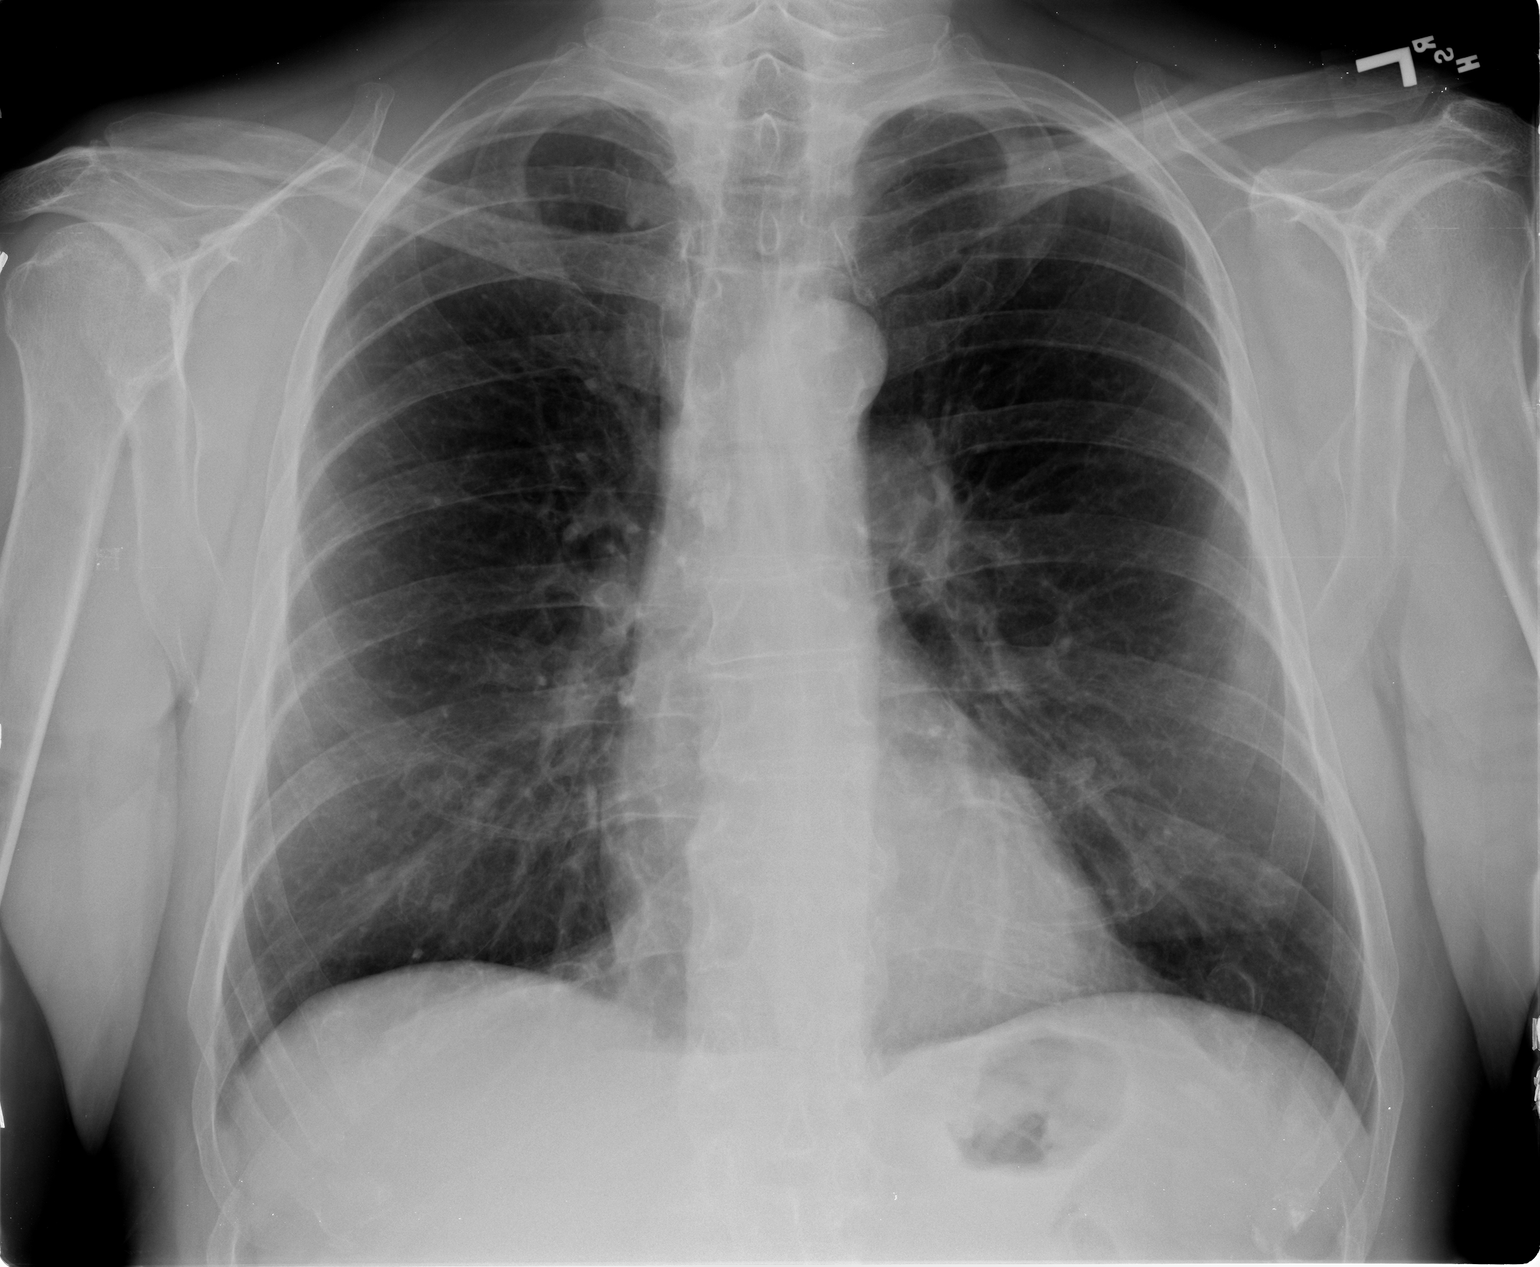

[view not recorded (2 of 2)]
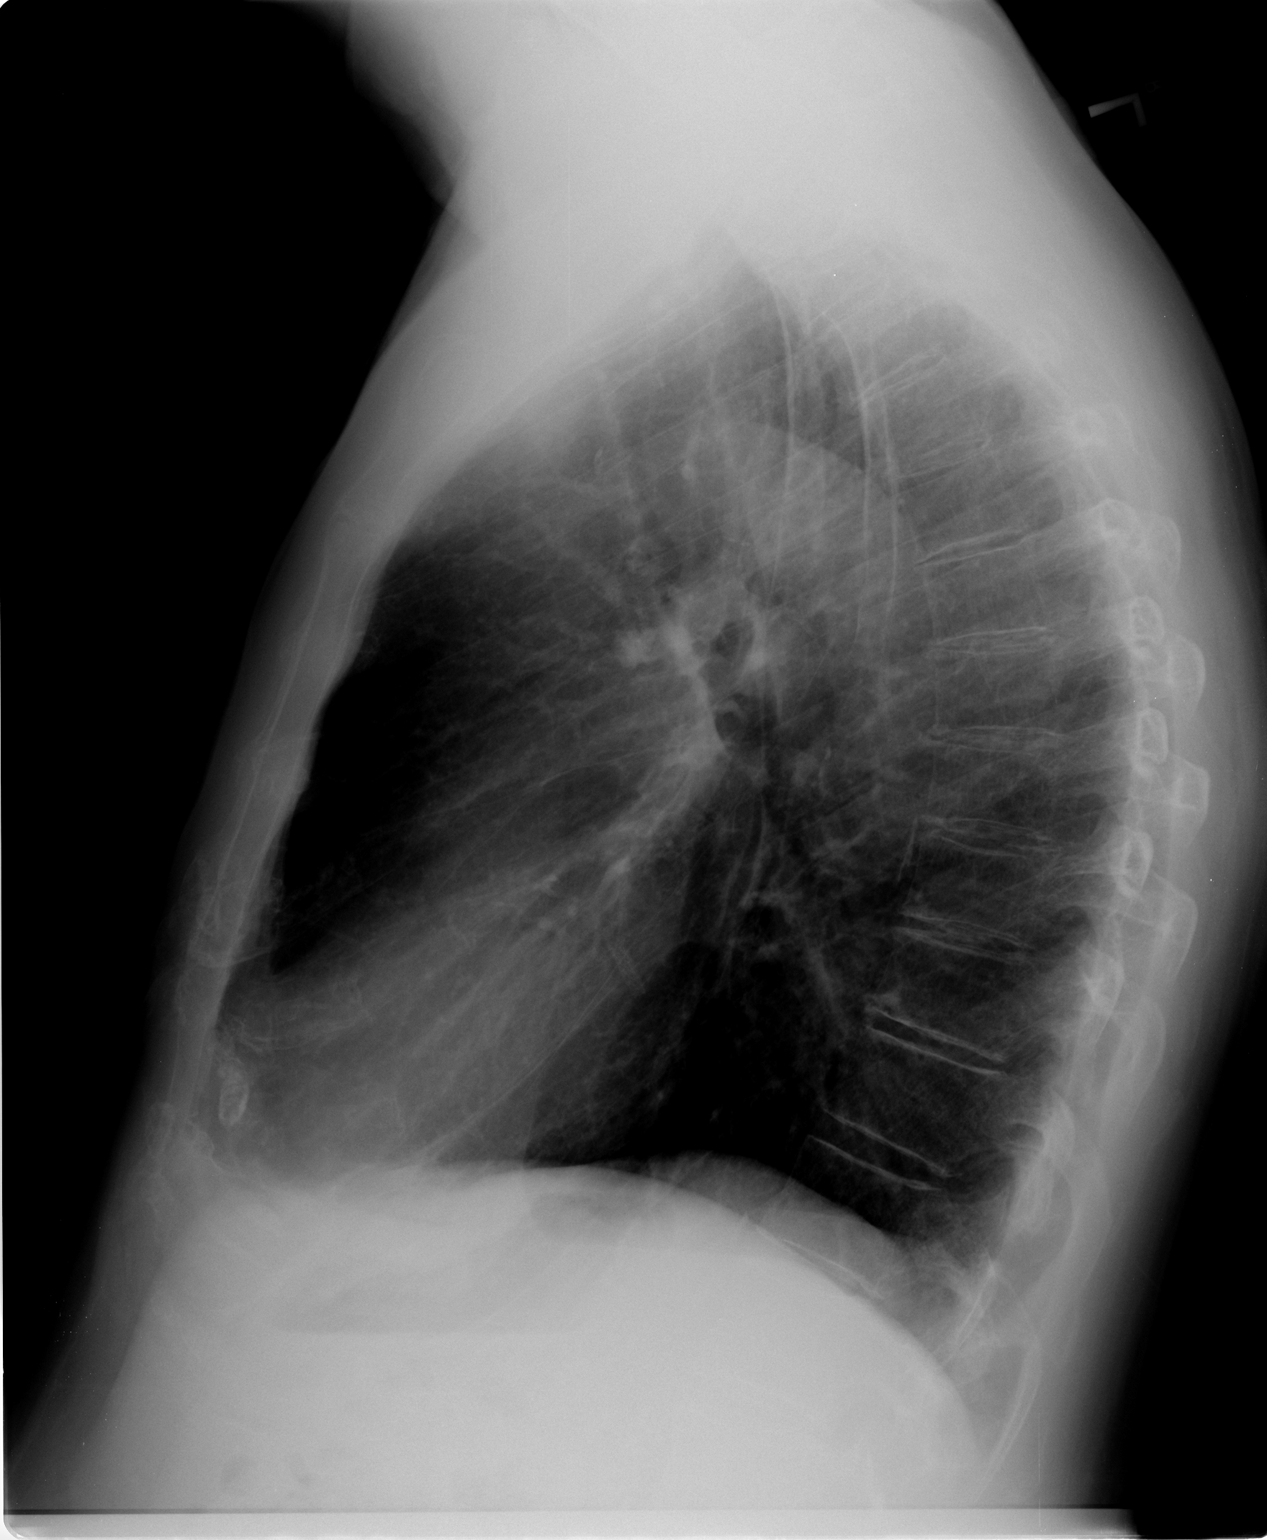

[2 of 2 positions shown; findings below may reference images not displayed]

FINDINGS: The cardiac silhouette is normal size and shape. Ectasia
and nonaneurysmal calcification of the thoracic aorta are seen.
Mild hyperinflation configuration.  Hilar regions appear stable.
No pulmonary edema, pneumonia, pleural effusion is seen.  There is
some central peribronchial thickening.  Osteopenic appearance of
the bones.  Degenerative spondylosis and degenerative disc disease
changes.
IMPRESSION: Hyperinflation configuration with minimal central peribronchial
thickening.  No pulmonary edema, pneumonia, or other acute process
is seen.  Chronic findings are  detailed above.

## 2013-06-27 IMAGING — CR DG CHEST 2V
2 series · 2 of 2 positions shown · non-contrast
Comparison: 07/05/2010

CLINICAL DATA: Chest and back pain

CHEST - 2 VIEW

[view not recorded (1 of 2)]
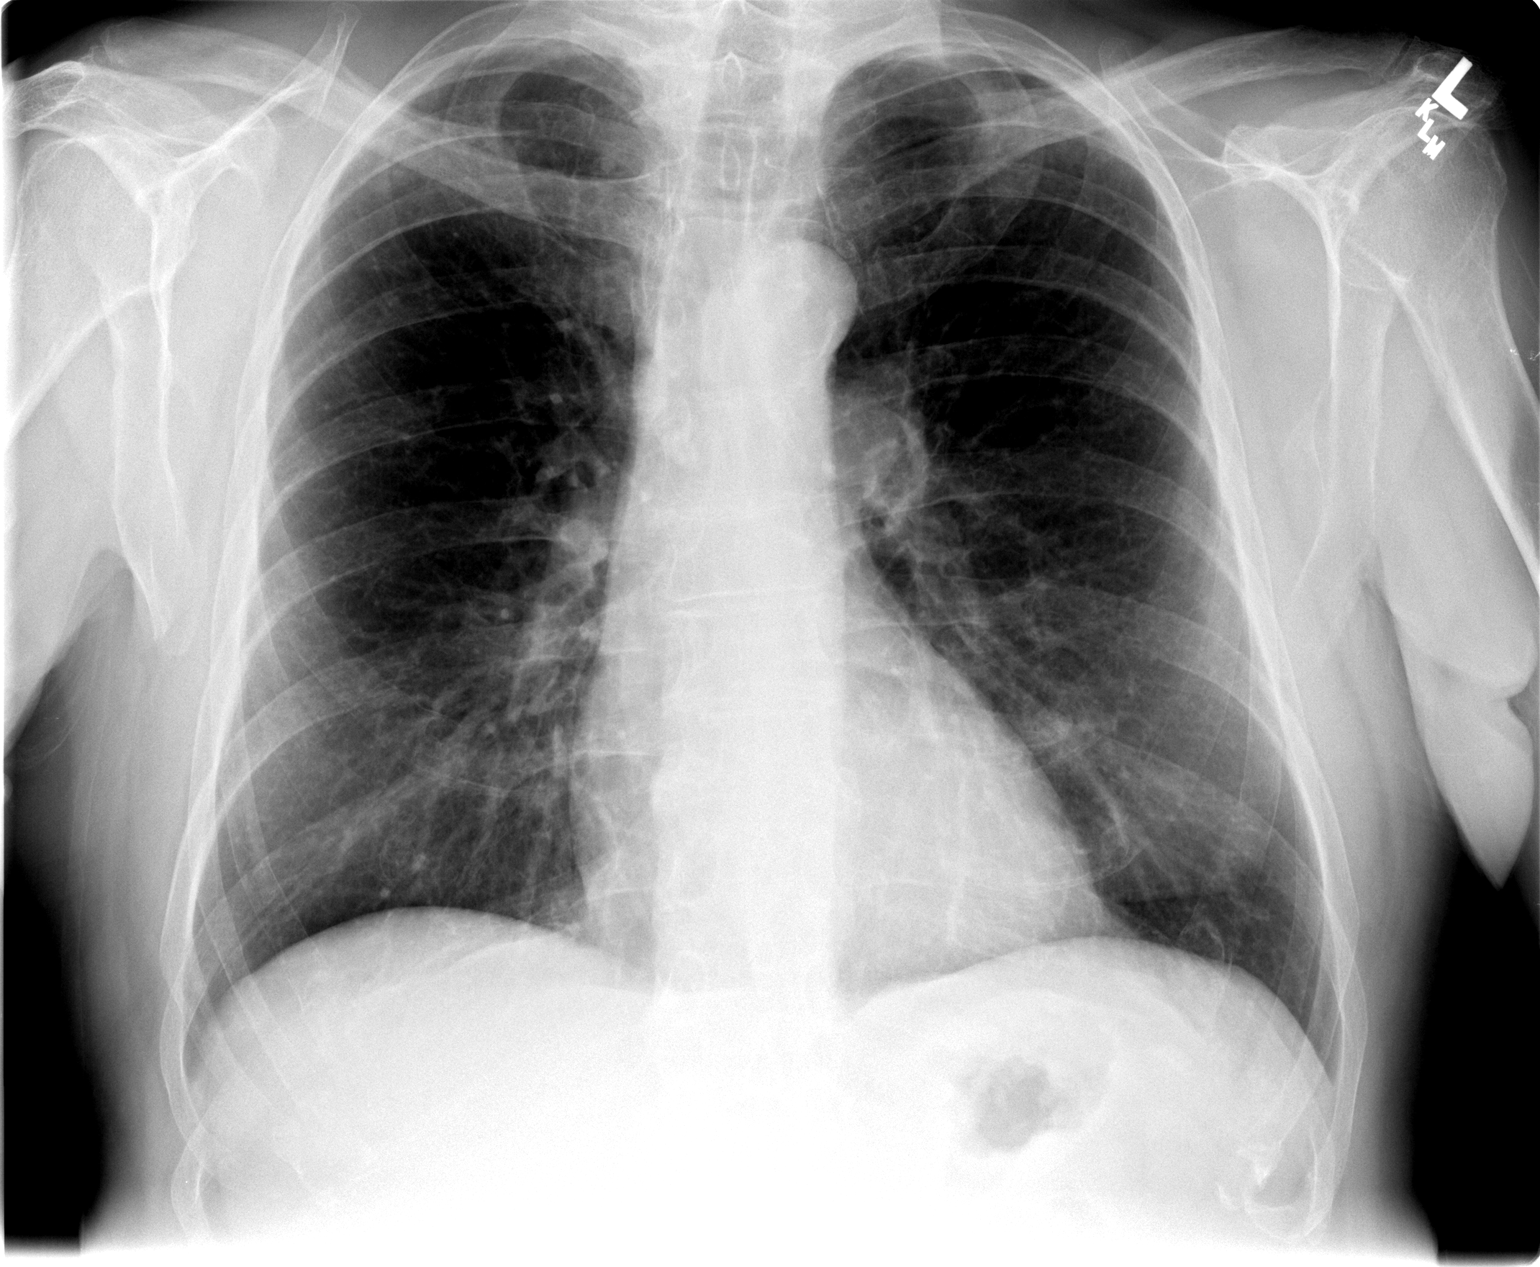

[view not recorded (2 of 2)]
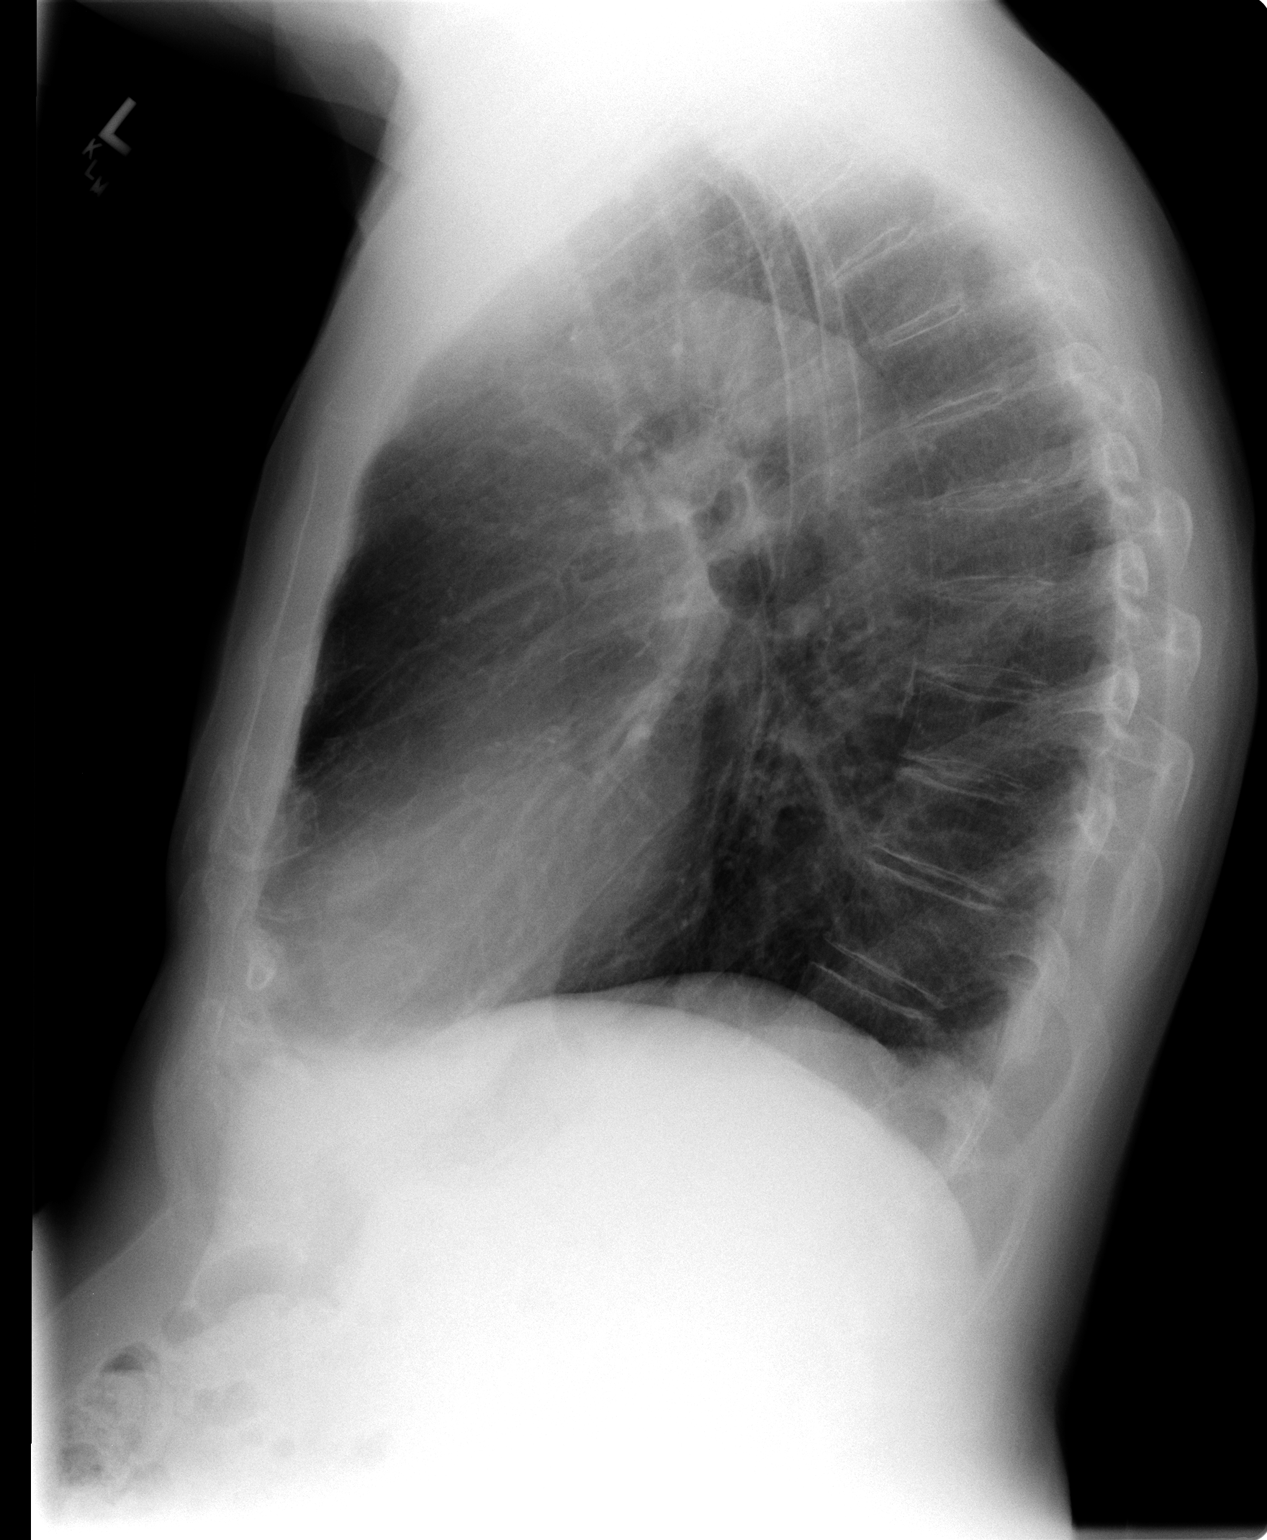

[2 of 2 positions shown; findings below may reference images not displayed]

FINDINGS: Normal heart size.  Stable bronchitic changes.  No
consolidation, mass, pneumothorax, pleural effusion.  Lungs are
mildly hyperaerated.
IMPRESSION: No active cardiopulmonary disease.  Chronic changes.

## 2014-01-05 ENCOUNTER — Encounter: Payer: Self-pay | Admitting: *Deleted

## 2014-01-19 NOTE — Progress Notes (Signed)
This encounter was created in error - please disregard.

## 2014-01-19 NOTE — Addendum Note (Signed)
Addended by: Hollace Kinnier on: 01/19/2014 02:45 PM   Modules accepted: Level of Service, SmartSet

## 2014-05-02 ENCOUNTER — Encounter: Payer: Self-pay | Admitting: *Deleted

## 2014-05-02 NOTE — Progress Notes (Signed)
Documentation corrected for incorrect abstract encounter on 01/05/14

## 2015-05-20 DEATH — deceased
